# Patient Record
Sex: Female | Born: 1955 | Race: White | Hispanic: No | Marital: Married | Smoking: Never smoker
Health system: Southern US, Community
[De-identification: ages and names within clinical notes are randomized; demographics above are authoritative.]

## PROBLEM LIST (undated history)

## (undated) DIAGNOSIS — N200 Calculus of kidney: Secondary | ICD-10-CM

## (undated) DIAGNOSIS — E079 Disorder of thyroid, unspecified: Secondary | ICD-10-CM

## (undated) HISTORY — PX: LITHOTRIPSY: SUR834

## (undated) HISTORY — PX: THYROID SURGERY: SHX805

## (undated) HISTORY — PX: APPENDECTOMY: SHX54

---

## 2009-06-04 ENCOUNTER — Ambulatory Visit: Payer: Self-pay | Admitting: Radiology

## 2009-06-04 ENCOUNTER — Emergency Department (HOSPITAL_BASED_OUTPATIENT_CLINIC_OR_DEPARTMENT_OTHER): Admission: EM | Admit: 2009-06-04 | Discharge: 2009-06-04 | Payer: Self-pay | Admitting: Emergency Medicine

## 2015-08-09 ENCOUNTER — Emergency Department (HOSPITAL_BASED_OUTPATIENT_CLINIC_OR_DEPARTMENT_OTHER)
Admission: EM | Admit: 2015-08-09 | Discharge: 2015-08-09 | Disposition: A | Discharge status: Home / Self Care | Payer: BLUE CROSS/BLUE SHIELD | Attending: Emergency Medicine | Admitting: Emergency Medicine

## 2015-08-09 ENCOUNTER — Encounter (HOSPITAL_BASED_OUTPATIENT_CLINIC_OR_DEPARTMENT_OTHER): Payer: Self-pay | Admitting: *Deleted

## 2015-08-09 ENCOUNTER — Emergency Department (HOSPITAL_BASED_OUTPATIENT_CLINIC_OR_DEPARTMENT_OTHER): Payer: BLUE CROSS/BLUE SHIELD

## 2015-08-09 DIAGNOSIS — Z9049 Acquired absence of other specified parts of digestive tract: Secondary | ICD-10-CM | POA: Diagnosis not present

## 2015-08-09 DIAGNOSIS — Z87442 Personal history of urinary calculi: Secondary | ICD-10-CM | POA: Insufficient documentation

## 2015-08-09 DIAGNOSIS — Z791 Long term (current) use of non-steroidal anti-inflammatories (NSAID): Secondary | ICD-10-CM | POA: Diagnosis not present

## 2015-08-09 DIAGNOSIS — R1031 Right lower quadrant pain: Secondary | ICD-10-CM | POA: Diagnosis not present

## 2015-08-09 DIAGNOSIS — Z91040 Latex allergy status: Secondary | ICD-10-CM | POA: Diagnosis not present

## 2015-08-09 DIAGNOSIS — M545 Low back pain: Secondary | ICD-10-CM | POA: Diagnosis present

## 2015-08-09 DIAGNOSIS — M5441 Lumbago with sciatica, right side: Secondary | ICD-10-CM

## 2015-08-09 DIAGNOSIS — Z87440 Personal history of urinary (tract) infections: Secondary | ICD-10-CM | POA: Insufficient documentation

## 2015-08-09 DIAGNOSIS — E079 Disorder of thyroid, unspecified: Secondary | ICD-10-CM | POA: Diagnosis not present

## 2015-08-09 HISTORY — DX: Calculus of kidney: N20.0

## 2015-08-09 HISTORY — DX: Disorder of thyroid, unspecified: E07.9

## 2015-08-09 LAB — URINALYSIS, ROUTINE W REFLEX MICROSCOPIC
BILIRUBIN URINE: NEGATIVE
GLUCOSE, UA: NEGATIVE mg/dL
Ketones, ur: NEGATIVE mg/dL
Nitrite: NEGATIVE
PROTEIN: NEGATIVE mg/dL
SPECIFIC GRAVITY, URINE: 1.021 (ref 1.005–1.030)
pH: 6.5 (ref 5.0–8.0)

## 2015-08-09 LAB — COMPREHENSIVE METABOLIC PANEL
ALBUMIN: 4.1 g/dL (ref 3.5–5.0)
ALT: 24 U/L (ref 14–54)
AST: 23 U/L (ref 15–41)
Alkaline Phosphatase: 53 U/L (ref 38–126)
Anion gap: 7 (ref 5–15)
BILIRUBIN TOTAL: 0.7 mg/dL (ref 0.3–1.2)
BUN: 31 mg/dL — AB (ref 6–20)
CO2: 29 mmol/L (ref 22–32)
CREATININE: 0.58 mg/dL (ref 0.44–1.00)
Calcium: 9.5 mg/dL (ref 8.9–10.3)
Chloride: 104 mmol/L (ref 101–111)
GFR calc Af Amer: >60 mL/min (ref >60)
GFR calc non Af Amer: >60 mL/min (ref >60)
GLUCOSE: 99 mg/dL (ref 65–99)
POTASSIUM: 3.9 mmol/L (ref 3.5–5.1)
Sodium: 140 mmol/L (ref 135–145)
TOTAL PROTEIN: 6.7 g/dL (ref 6.5–8.1)

## 2015-08-09 LAB — URINE MICROSCOPIC-ADD ON

## 2015-08-09 LAB — CBC WITH DIFFERENTIAL/PLATELET
BASOS ABS: 0 10*3/uL (ref 0.0–0.1)
Basophils Relative: 0 %
EOS PCT: 2 %
Eosinophils Absolute: 0.1 10*3/uL (ref 0.0–0.7)
HEMATOCRIT: 41.7 % (ref 36.0–46.0)
HEMOGLOBIN: 13.8 g/dL (ref 12.0–15.0)
LYMPHS ABS: 1.4 10*3/uL (ref 0.7–4.0)
LYMPHS PCT: 27 %
MCH: 31.4 pg (ref 26.0–34.0)
MCHC: 33.1 g/dL (ref 30.0–36.0)
MCV: 94.8 fL (ref 78.0–100.0)
Monocytes Absolute: 0.5 10*3/uL (ref 0.1–1.0)
Monocytes Relative: 9 %
NEUTROS ABS: 3.3 10*3/uL (ref 1.7–7.7)
NEUTROS PCT: 62 %
PLATELETS: 195 10*3/uL (ref 150–400)
RBC: 4.4 MIL/uL (ref 3.87–5.11)
RDW: 12.1 % (ref 11.5–15.5)
WBC: 5.3 10*3/uL (ref 4.0–10.5)

## 2015-08-09 LAB — LIPASE, BLOOD: Lipase: 25 U/L (ref 11–51)

## 2015-08-09 MED ORDER — LIDOCAINE-EPINEPHRINE (PF) 2 %-1:200000 IJ SOLN
10.0000 mL | Freq: Once | INTRAMUSCULAR | Status: DC
  Filled 2015-08-09: qty 10

## 2015-08-09 MED ORDER — KETOROLAC TROMETHAMINE 30 MG/ML IJ SOLN
30.0000 mg | Freq: Once | INTRAMUSCULAR | Status: AC
  Administered 2015-08-09: 30 mg via INTRAVENOUS
  Filled 2015-08-09: qty 1

## 2015-08-09 MED ORDER — KETOROLAC TROMETHAMINE 30 MG/ML IJ SOLN: 30.0000 mg | Freq: Once | INTRAMUSCULAR | Status: DC

## 2015-08-09 MED ORDER — IBUPROFEN 800 MG PO TABS: 800.0000 mg | ORAL_TABLET | Freq: Three times a day (TID) | ORAL | Status: AC

## 2015-08-09 NOTE — ED Provider Notes (Signed)
CSN: [id number]     Arrival date & time 08/09/15  1146 History   First MD Initiated Contact with Patient 08/09/15 1202     Chief Complaint  Patient presents with  . Back Pain     (Consider location/radiation/quality/duration/timing/severity/associated sxs/prior Treatment) HPI   Patient is a 59 year old female with past medical history of multiple kidney stones requiring lithotripsy who presents to the ED with complaint of right lower back pain, onset 2 months. Patient reports 2 months ago she slipped on a pecan, and resulting in her straining her back to keep herself from falling. Patient reports having constant severe cramping pain to her right lower back that radiates down her right leg and across to her right lower quadrant/groin region. She reports the pain is worse when bending over. She notes she has been using ibuprofen at home with no relief. Pt denies fever, numbness, tingling, saddle anesthesia, loss of bowel or bladder, urinary sxs, N/V, weakness, IVDU, cancer. Patient reports seeing a chiropractor twice over 3 weeks ago with no improvement of pain.  She notes she was seen at [facility] ED a few weeks ago, negative CT scan and was discharged home with oxycodone. She reports no relief of symptoms with oxycodone. She notes she was recently diagnosed with a UTI and had a lithotripsy done 4 days ago, she reports she has since finished her antibiotics.  Past Medical History  Diagnosis Date  . Kidney calculus   . Thyroid disease    Past Surgical History  Procedure Laterality Date  . Lithotripsy    . Thyroid surgery    . Appendectomy     History reviewed. No pertinent family history. Social History  Substance Use Topics  . Smoking status: Never Smoker   . Smokeless tobacco: None  . Alcohol Use: No   OB History    No data available     Review of Systems  Gastrointestinal: Positive for abdominal pain.  Musculoskeletal: Positive for back pain.  All other systems reviewed and  are negative.     Allergies  Augmentin; Ivp dye; Latex; and Versed  Home Medications   Prior to Admission medications   Medication Sig Start Date End Date Taking? Authorizing Provider  levothyroxine (SYNTHROID, LEVOTHROID) 125 MCG tablet Take 125 mcg by mouth daily before breakfast.   Yes Historical Provider, MD  naproxen (NAPROSYN) 500 MG tablet Take 500 mg by mouth 2 (two) times daily with a meal.   Yes Historical Provider, MD  ibuprofen (ADVIL,MOTRIN) 800 MG tablet Take 1 tablet (800 mg total) by mouth 3 (three) times daily. 08/09/15   Satira Sark Nadeau, PA-C   BP 121/74 mmHg  Pulse 82  Temp(Src) 98.5 F (36.9 C) (Oral)  Resp 16  Ht  (1.727 m)  Wt 61.236 kg  BMI 20.53 kg/m2  SpO2 100% Physical Exam  Constitutional: She is oriented to person, place, and time. She appears well-developed and well-nourished. No distress.  HENT:  Head: Normocephalic and atraumatic.  Mouth/Throat: Oropharynx is clear and moist. No oropharyngeal exudate.  Eyes: Conjunctivae and EOM are normal. Pupils are equal, round, and reactive to light. Right eye exhibits no discharge. Left eye exhibits no discharge. No scleral icterus.  Neck: Normal range of motion. Neck supple.  Cardiovascular: Normal rate, regular rhythm, normal heart sounds and intact distal pulses.   Pulmonary/Chest: Effort normal and breath sounds normal. No respiratory distress. She has no wheezes. She has no rales. She exhibits no tenderness.  Abdominal: Soft. Bowel sounds are  normal. She exhibits no distension and no mass. There is tenderness in the right lower quadrant. There is no rigidity, no rebound, no guarding, no CVA tenderness, no tenderness at McBurney's point and negative Murphy's sign.  Musculoskeletal: She exhibits tenderness. She exhibits no edema.       Lumbar back: She exhibits decreased range of motion (due to pain), tenderness, bony tenderness and swelling (mild swelling noted over lower lumbar vertebra). She  exhibits no edema, no deformity, no laceration and no spasm.  Mild tenderness over lumbar midline and right sided paraspinal muscles. Positive right sided straight leg raise. 5/5 strength BLE. Decreased ROM of back and right hip due to pain. 2+ PT pulses. FROM of bilateral knees, ankles and feet. Sensation intact.   Lymphadenopathy:    She has no cervical adenopathy.  Neurological: She is alert and oriented to person, place, and time. She has normal strength and normal reflexes. No sensory deficit.  Pt able to stand and ambulate but reports pain during ambulation to right lower back and thigh.  Skin: Skin is warm and dry. She is not diaphoretic.  Nursing note and vitals reviewed.   ED Course  Procedures (including critical care time) Labs Review Labs Reviewed  COMPREHENSIVE METABOLIC PANEL - Abnormal; Notable for the following:    BUN 31 (*)    All other components within normal limits  URINALYSIS, ROUTINE W REFLEX MICROSCOPIC (NOT AT [facility]) - Abnormal; Notable for the following:    APPearance CLOUDY (*)    Hgb urine dipstick MODERATE (*)    Leukocytes, UA SMALL (*)    All other components within normal limits  URINE MICROSCOPIC-ADD ON - Abnormal; Notable for the following:    Squamous Epithelial / LPF 0-5 (*)    Bacteria, UA FEW (*)    All other components within normal limits  CBC WITH DIFFERENTIAL/PLATELET  LIPASE, BLOOD    Imaging Review Dg Lumbar Spine Complete  08/09/2015  CLINICAL DATA:  Lumbago and spasm after slipping and nearly falling 1 month prior EXAM: LUMBAR SPINE - COMPLETE 4+ VIEW COMPARISON:  None. FINDINGS: Frontal, lateral, spot lumbosacral lateral, and bilateral oblique views were obtained. There are 5 non-rib-bearing lumbar type vertebral bodies. There is slight thoracolumbar dextroscoliosis. There is no fracture or spondylolisthesis. There is mild disc space narrowing at L4-5. Other disc spaces appear unremarkable. There is facet osteoarthritic change at L4-5 and  L5-S1 bilaterally. There are several small calcifications in the region of the upper pole of the right kidney. 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 Slight scoliosis. No fracture or spondylolisthesis. Small calcifications in the upper pole of the right kidney. Electronically Signed   By: Bretta Bang III M.D.   On: 08/09/2015 13:30   I have personally reviewed and evaluated these images and lab results as part of my medical decision-making.  Filed Vitals:   08/09/15 1159  BP: 121/74  Pulse: 82  Temp: 98.5 F (36.9 C)  Resp: 16     MDM   Final diagnoses:  Right-sided low back pain with right-sided sciatica    Patient presents with right lower back pain that occurred after slipping on a peak on and straining her back. No back pain red flags. She reports having recent lithotripsy done 4 days ago and finishing her antibiotics for recent UTI prior to the procedure. VSS. Revealed mild tenderness over lumbar midline and right paraspinal muscles, mild swelling noted over midline and right paraspinal muscles lumbar region, positive right straight leg raise.  No neuro deficits. Patient able to stand and ambulate and room. Patient given pain meds in the ED. Lumbar x-ray negative. Labs unremarkable. UA revealed moderate age to be, I suspect this is likely associated with recent lithotripsy. I do not suspect spinal cord compression/cauda equina syndrome or infectious etiology at this time. Patient notes her pain has not improved. Plan to do a trigger point injection for pain relief. I injected 5 mL of 2% lidocaine with epi to right lower lumbar paraspinal region. Patient reports immediate relief of pain after injection.  I suspect pain is likely musculoskeletal in etiology. Plan to discharge patient home with NSAIDs and advised patient to follow up with primary care provider.   Satira Sarkicole Elizabeth MelvernNadeau, [place] JerseyPA-C 08/09/15 2321  Gwyneth SproutWhitney Plunkett, MD 08/10/15 [phone]

## 2015-08-09 NOTE — ED Notes (Signed)
Pt c/o lower right back pain x 2 months. Seen at [place] ED ct scan neg.

## 2015-08-09 NOTE — Discharge Instructions (Signed)
Take your medications as prescribed as needed for pain relief. You may also continue applying heat for pain relief as needed. Follow-up with your primary care provider in 4-5 days.  Please return to the Emergency Department if symptoms worsen or new onset of fever, numbness, tingling, saddle anesthesia, loss of bowel or bladder, weakness.

## 2016-05-28 ENCOUNTER — Emergency Department (HOSPITAL_BASED_OUTPATIENT_CLINIC_OR_DEPARTMENT_OTHER)
Admission: EM | Admit: 2016-05-28 | Discharge: 2016-05-28 | Disposition: A | Discharge status: Home / Self Care | Payer: BLUE CROSS/BLUE SHIELD | Attending: Emergency Medicine | Admitting: Emergency Medicine

## 2016-05-28 ENCOUNTER — Emergency Department (HOSPITAL_BASED_OUTPATIENT_CLINIC_OR_DEPARTMENT_OTHER): Payer: BLUE CROSS/BLUE SHIELD

## 2016-05-28 ENCOUNTER — Encounter (HOSPITAL_BASED_OUTPATIENT_CLINIC_OR_DEPARTMENT_OTHER): Payer: Self-pay | Admitting: *Deleted

## 2016-05-28 DIAGNOSIS — Y929 Unspecified place or not applicable: Secondary | ICD-10-CM | POA: Insufficient documentation

## 2016-05-28 DIAGNOSIS — S4992XA Unspecified injury of left shoulder and upper arm, initial encounter: Secondary | ICD-10-CM | POA: Diagnosis present

## 2016-05-28 DIAGNOSIS — Y999 Unspecified external cause status: Secondary | ICD-10-CM | POA: Insufficient documentation

## 2016-05-28 DIAGNOSIS — W109XXA Fall (on) (from) unspecified stairs and steps, initial encounter: Secondary | ICD-10-CM | POA: Insufficient documentation

## 2016-05-28 DIAGNOSIS — Z79899 Other long term (current) drug therapy: Secondary | ICD-10-CM | POA: Diagnosis not present

## 2016-05-28 DIAGNOSIS — Z791 Long term (current) use of non-steroidal anti-inflammatories (NSAID): Secondary | ICD-10-CM | POA: Diagnosis not present

## 2016-05-28 DIAGNOSIS — S42002A Fracture of unspecified part of left clavicle, initial encounter for closed fracture: Secondary | ICD-10-CM

## 2016-05-28 DIAGNOSIS — S42032A Displaced fracture of lateral end of left clavicle, initial encounter for closed fracture: Secondary | ICD-10-CM | POA: Diagnosis not present

## 2016-05-28 DIAGNOSIS — Y939 Activity, unspecified: Secondary | ICD-10-CM | POA: Insufficient documentation

## 2016-05-28 NOTE — ED Provider Notes (Signed)
[facility] Provider [facility]   CSN: [id number] Arrival date & time: 05/28/16  [id number]     History   Chief Complaint Chief Complaint  Patient presents with  . Shoulder Injury    HPI Bethany Macias is a 60 y.o. female.  HPI Patient presents to the emergency department with left shoulder pain following a fall that occurred last night.  The patient states that she tripped over her dog and fell down her staircase.  The patient states that she did not hit her head.  The patient states that she took Aleve with some relief of her symptoms.  Patient states that movement and palpation make the pain worse. The patient denies chest pain, shortness of breath, headache,blurred vision, neck pain, fever, cough, weakness, numbness, dizziness, anorexia, edema, abdominal pain, nausea, vomiting, diarrhea, rash, back pain, dysuria, hematemesis, bloody stool, near syncope, or syncope. Past Medical History:  Diagnosis Date  . Kidney calculus   . Thyroid disease     There are no active problems to display for this patient.   Past Surgical History:  Procedure Laterality Date  . APPENDECTOMY    . LITHOTRIPSY    . THYROID SURGERY      OB History    No data available       Home Medications    Prior to Admission medications   Medication Sig Start Date End Date Taking? Authorizing Provider  ibuprofen (ADVIL,MOTRIN) 800 MG tablet Take 1 tablet (800 mg total) by mouth 3 (three) times daily. 08/09/15  Yes Barrett HenleNicole Elizabeth Nadeau, PA-C  levothyroxine (SYNTHROID, LEVOTHROID) 125 MCG tablet Take 125 mcg by mouth daily before breakfast.   Yes Historical Provider, MD  naproxen (NAPROSYN) 500 MG tablet Take 500 mg by mouth 2 (two) times daily with a meal.   Yes Historical Provider, MD    Family History No family history on file.  Social History Social History  Substance Use Topics  . Smoking status: Never Smoker  . Smokeless tobacco: Never Used  . Alcohol use No     Allergies     Augmentin [amoxicillin-pot clavulanate]; Ivp dye [iodinated diagnostic agents]; Latex; and Versed [midazolam]   Review of Systems Review of Systems  All other systems negative except as documented in the HPI. All pertinent positives and negatives as reviewed in the HPI. Physical Exam Updated Vital Signs BP 132/92 (BP Location: Right Arm)   Pulse 89   Temp 97.7 F (36.5 C) (Oral)   Resp 18   Ht 5\' 7"  (1.702 m)   Wt 58.1 kg   SpO2 100%   BMI 20.05 kg/m   Physical Exam  Constitutional: She is oriented to person, place, and time. She appears well-developed and well-nourished. No distress.  HENT:  Head: Normocephalic and atraumatic.  Mouth/Throat: Oropharynx is clear and moist.  Eyes: Pupils are equal, round, and reactive to light.  Neck: Normal range of motion. Neck supple.  Cardiovascular: Normal rate, regular rhythm and normal heart sounds.  Exam reveals no gallop and no friction rub.   No murmur heard. Pulmonary/Chest: Effort normal and breath sounds normal. No respiratory distress. She has no wheezes.  Neurological: She is alert and oriented to person, place, and time. She exhibits normal muscle tone. Coordination normal.  Skin: Skin is warm and dry. No rash noted. No erythema.  Psychiatric: She has a normal mood and affect. Her behavior is normal.  Nursing note and vitals reviewed.    ED Treatments / Results  Labs (all labs ordered are  listed, but only abnormal results are displayed) Labs Reviewed - No data to display  EKG  EKG Interpretation None       Radiology Dg Shoulder Left  Result Date: 05/28/2016 CLINICAL DATA:  Fall EXAM: LEFT SHOULDER - 2+ VIEW COMPARISON:  None. FINDINGS: There is an oblique fracture of the distal shaft of the clavicle. The fracture fragments are in near anatomic alignment. No dislocations. IMPRESSION: 1. Acute distal clavicle fracture. Electronically Signed   By: Signa Kell M.D.   On: 05/28/2016 09:55    Procedures Procedures  (including critical care time)  Medications Ordered in ED Medications - No data to display   Initial Impression / Assessment and Plan / ED Course  I have reviewed the triage vital signs and the nursing notes.  Pertinent labs & imaging results that were available during my care of the patient were reviewed by me and considered in my medical decision making (see chart for details).  Clinical Course  Patient be referred to orthopedics to return here as needed.  Patient agrees the plan and all questions were answered.  I advised her to ice and elevate the area  Final Clinical Impressions(s) / ED Diagnoses   Final diagnoses:  None    New Prescriptions New Prescriptions   No medications on file     Charlestine Night, PA-C 05/28/16 1210    Loren Racer, MD 05/28/16 1521

## 2016-05-28 NOTE — Discharge Instructions (Signed)
Return here as needed.  Follow-up with the orthopedist provided.  Use ice over the area

## 2016-05-28 NOTE — ED Triage Notes (Signed)
States she was walking down her inside stairs that are wood and missed first step and fell to bottom of stairs about 2030 last pm. Bruising noted to left shoulder. No LOC. Ambulatory to room without difficulty. [facility] and they n[facility] her kidney she bled out to a HBG of 4 and her heart went into A-fib and she is wearing a heart monitor to left upper chest.

## 2018-05-20 IMAGING — CR DG SHOULDER 2+V*L*
3 series · 3 of 3 positions shown · non-contrast
Comparison: None.

CLINICAL DATA: Fall

EXAM:
LEFT SHOULDER - 2+ VIEW

[w shoulder grashey left]
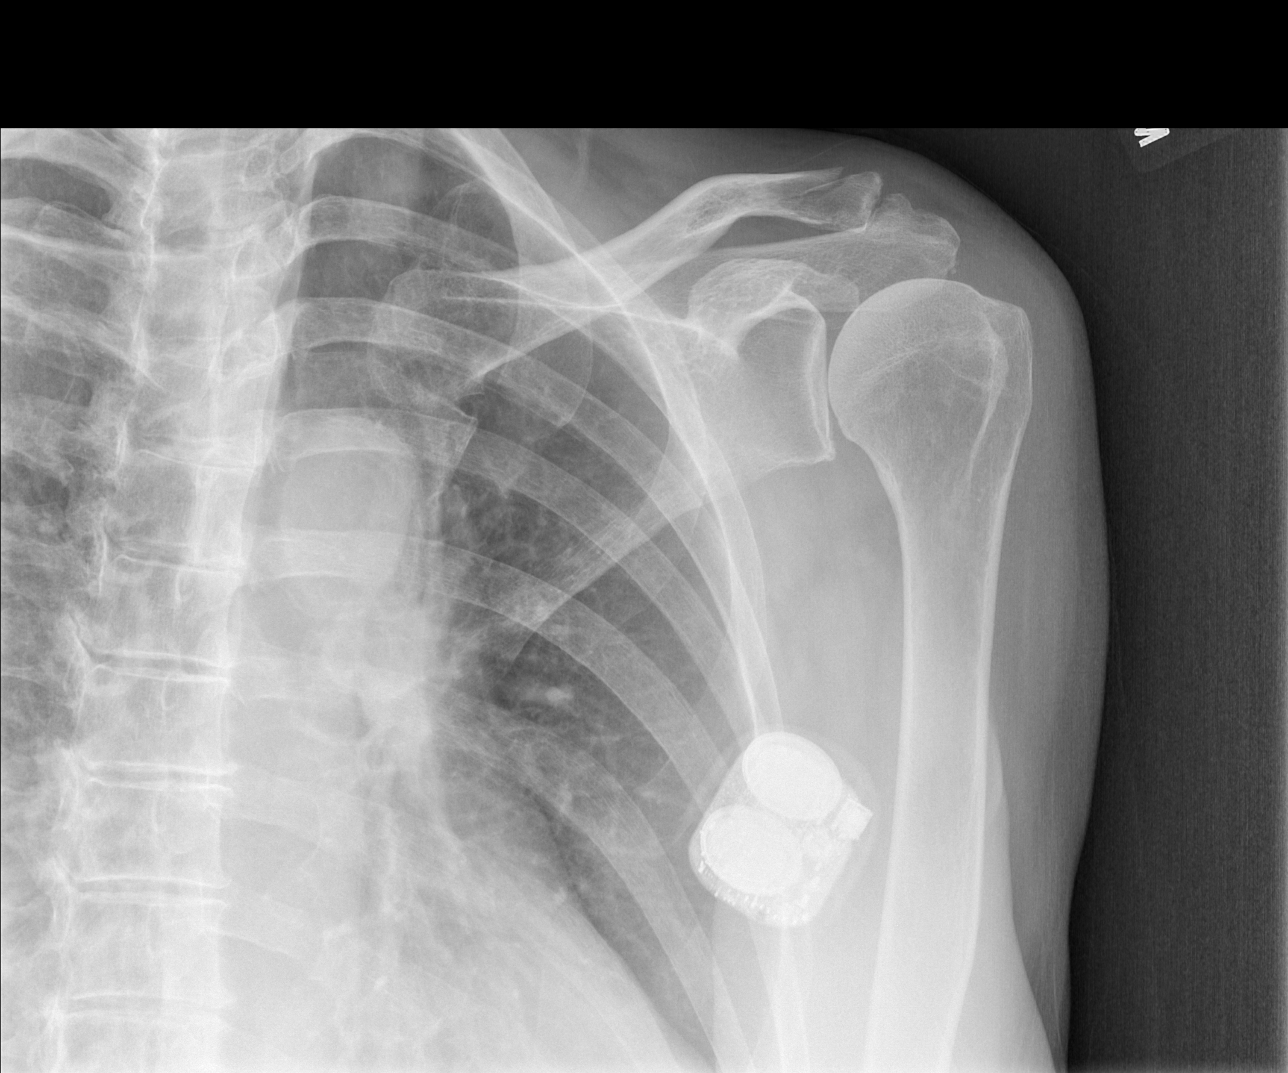

[w shoulder y view left]
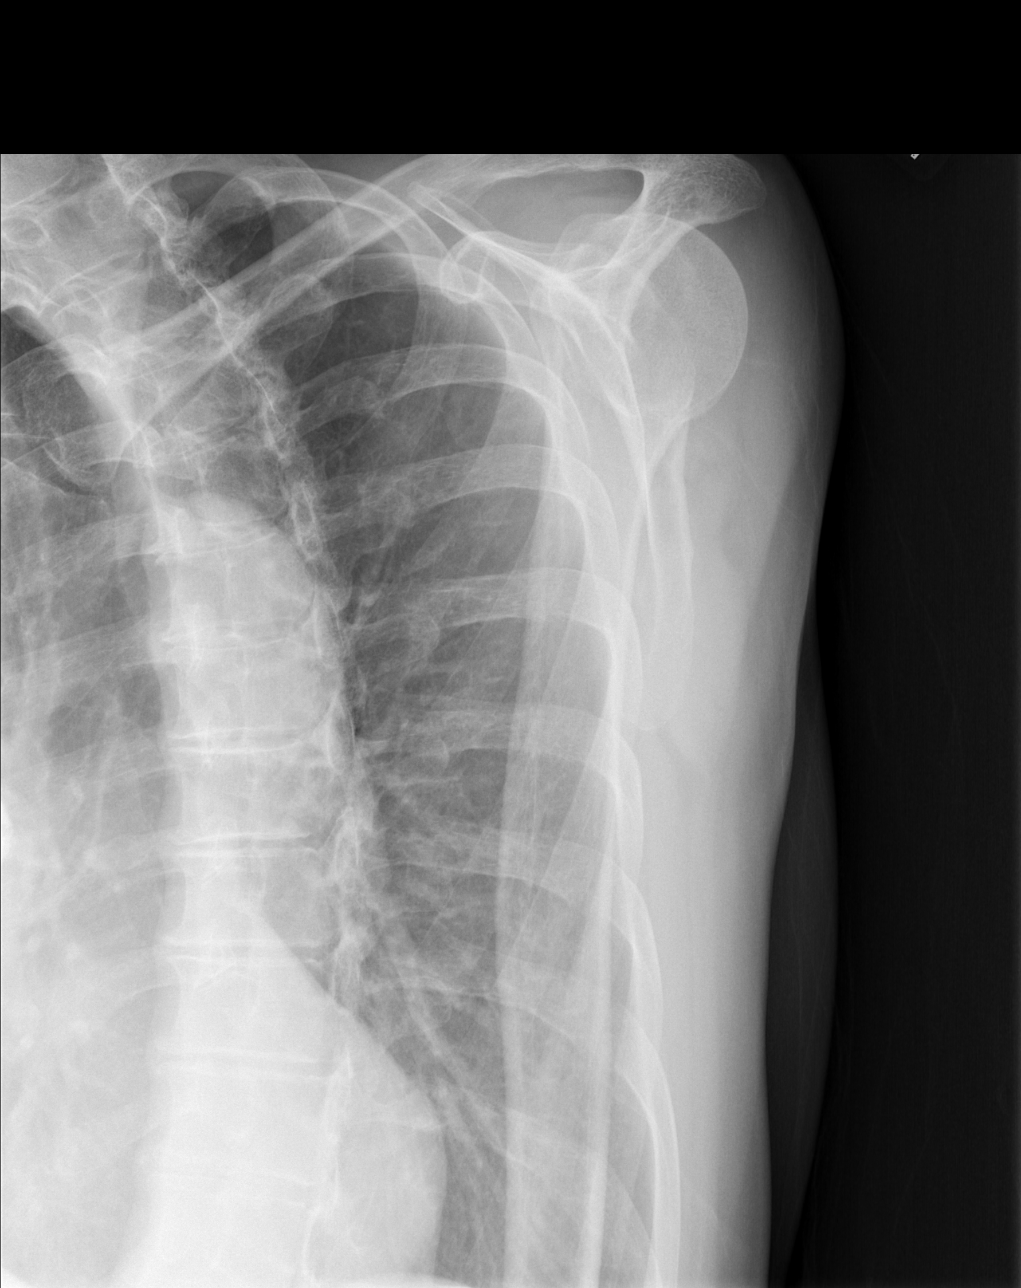

[x shoulder axillary left *]
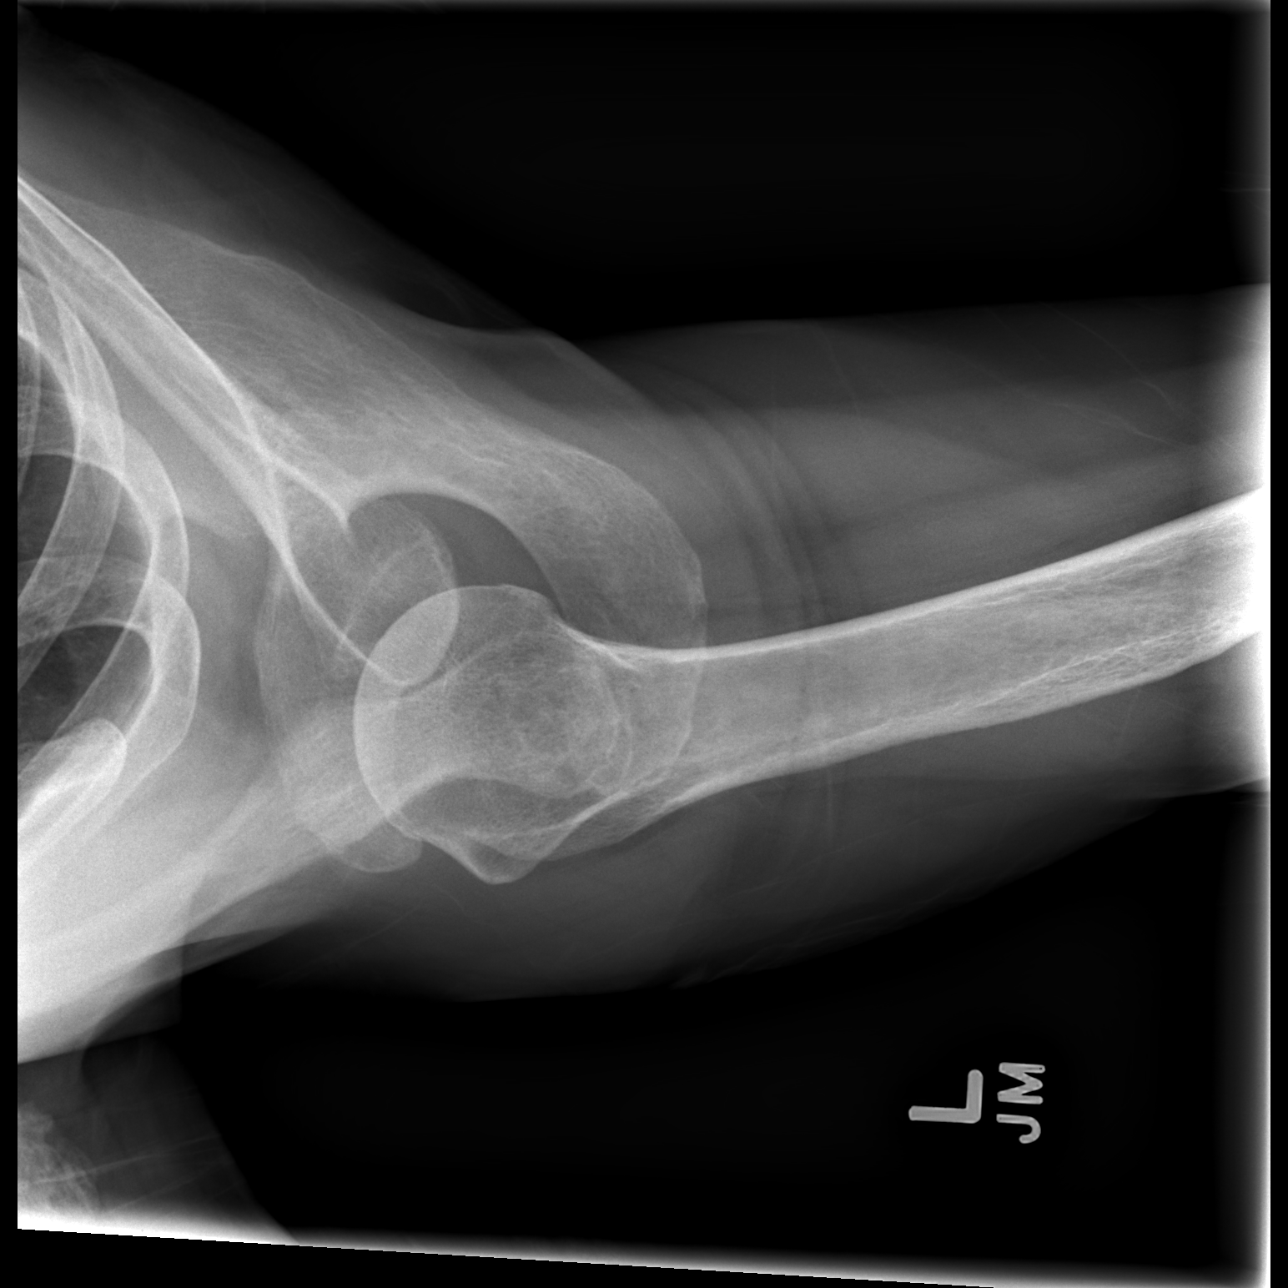

[3 of 3 positions shown; findings below may reference images not displayed]

FINDINGS: There is an oblique fracture of the distal shaft of the clavicle.
The fracture fragments are in near anatomic alignment. No
dislocations.
IMPRESSION: 1. Acute distal clavicle fracture.

## 2020-08-09 ENCOUNTER — Other Ambulatory Visit: Payer: Self-pay

## 2020-08-09 ENCOUNTER — Encounter (HOSPITAL_BASED_OUTPATIENT_CLINIC_OR_DEPARTMENT_OTHER): Payer: Self-pay

## 2020-08-09 ENCOUNTER — Emergency Department (HOSPITAL_BASED_OUTPATIENT_CLINIC_OR_DEPARTMENT_OTHER): Payer: BC Managed Care – PPO

## 2020-08-09 ENCOUNTER — Emergency Department (HOSPITAL_BASED_OUTPATIENT_CLINIC_OR_DEPARTMENT_OTHER)
Admission: EM | Admit: 2020-08-09 | Discharge: 2020-08-09 | Disposition: A | Discharge status: Home / Self Care | Payer: BC Managed Care – PPO | Attending: Emergency Medicine | Admitting: Emergency Medicine

## 2020-08-09 DIAGNOSIS — Z91040 Latex allergy status: Secondary | ICD-10-CM | POA: Diagnosis not present

## 2020-08-09 DIAGNOSIS — R600 Localized edema: Secondary | ICD-10-CM | POA: Insufficient documentation

## 2020-08-09 DIAGNOSIS — R002 Palpitations: Secondary | ICD-10-CM | POA: Diagnosis not present

## 2020-08-09 LAB — CBC WITH DIFFERENTIAL/PLATELET
Abs Immature Granulocytes: 0.03 10*3/uL (ref 0.00–0.07)
Basophils Absolute: 0 10*3/uL (ref 0.0–0.1)
Basophils Relative: 1 %
Eosinophils Absolute: 0.1 10*3/uL (ref 0.0–0.5)
Eosinophils Relative: 1 %
HCT: 40.5 % (ref 36.0–46.0)
Hemoglobin: 13.7 g/dL (ref 12.0–15.0)
Immature Granulocytes: 1 %
Lymphocytes Relative: 26 %
Lymphs Abs: 1.4 10*3/uL (ref 0.7–4.0)
MCH: 32.2 pg (ref 26.0–34.0)
MCHC: 33.8 g/dL (ref 30.0–36.0)
MCV: 95.1 fL (ref 80.0–100.0)
Monocytes Absolute: 0.5 10*3/uL (ref 0.1–1.0)
Monocytes Relative: 8 %
Neutro Abs: 3.5 10*3/uL (ref 1.7–7.7)
Neutrophils Relative %: 63 %
Platelets: 188 10*3/uL (ref 150–400)
RBC: 4.26 MIL/uL (ref 3.87–5.11)
RDW: 11.9 % (ref 11.5–15.5)
WBC: 5.5 10*3/uL (ref 4.0–10.5)
nRBC: 0 % (ref 0.0–0.2)

## 2020-08-09 LAB — COMPREHENSIVE METABOLIC PANEL
ALT: 22 U/L (ref 0–44)
AST: 25 U/L (ref 15–41)
Albumin: 3.7 g/dL (ref 3.5–5.0)
Alkaline Phosphatase: 49 U/L (ref 38–126)
Anion gap: 9 (ref 5–15)
BUN: 31 mg/dL — ABNORMAL HIGH (ref 8–23)
CO2: 29 mmol/L (ref 22–32)
Calcium: 8.6 mg/dL — ABNORMAL LOW (ref 8.9–10.3)
Chloride: 101 mmol/L (ref 98–111)
Creatinine, Ser: 0.68 mg/dL (ref 0.44–1.00)
GFR, Estimated: >60 mL/min (ref >60)
Glucose, Bld: 99 mg/dL (ref 70–99)
Potassium: 3.5 mmol/L (ref 3.5–5.1)
Sodium: 139 mmol/L (ref 135–145)
Total Bilirubin: 1.1 mg/dL (ref 0.3–1.2)
Total Protein: 6.2 g/dL — ABNORMAL LOW (ref 6.5–8.1)

## 2020-08-09 LAB — BRAIN NATRIURETIC PEPTIDE: B Natriuretic Peptide: 66.9 pg/mL (ref 0.0–100.0)

## 2020-08-09 NOTE — ED Provider Notes (Signed)
[facility] EMERGENCY DEPARTMENT Provider Note   CSN: [id number] Arrival date & time: 08/09/20  1024     History Chief Complaint  Patient presents with  . Leg Swelling    Bethany Macias is a 64 y.o. female presenting to the emergency department with complaint of about 4 days of worsening lower extremity edema. Patient states her lower legs have had some moderate swelling that is worse throughout the day. She is also been feeling a little bit more winded with exertion than usual. She feels some palpitations when she lays flat at night though this is not new for her and has been ongoing for multiple years. She reports both of her parents had heart failure around this age. She has never had known cardiac issues. She is followed by [facility] PCP, however has been unable to obtain an appointment with them. 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 She also admits to high salt diet which she has been trying to improve over the last few days since her swelling began. She is also trying to drink more water daily to help her swelling. The swelling improves somewhat when she elevates her legs.  Denies associated chest pain, cough, abdominal pain.  The history is provided by the patient.       Past Medical History:  Diagnosis Date  . Kidney calculus   . Thyroid disease     There are no problems to display for this patient.   Past Surgical History:  Procedure Laterality Date  . APPENDECTOMY    . LITHOTRIPSY    . THYROID SURGERY       OB History   No obstetric history on file.     History reviewed. No pertinent family history.  Social History   Tobacco Use  . Smoking status: Never Smoker  . Smokeless tobacco: Never Used  Substance Use Topics  . Alcohol use: Yes    Alcohol/week: 7.0 standard drinks    Types: 7 Glasses of wine per week  . Drug use: No    Home Medications Prior to Admission medications   Medication Sig Start Date End Date  Taking? Authorizing Provider  ibuprofen (ADVIL,MOTRIN) 800 MG tablet Take 1 tablet (800 mg total) by mouth 3 (three) times daily. 08/09/15   Barrett Henle, PA-C  levothyroxine (SYNTHROID, LEVOTHROID) 125 MCG tablet Take 125 mcg by mouth daily before breakfast.    [provider]  naproxen (NAPROSYN) 500 MG tablet Take 500 mg by mouth 2 (two) times daily with a meal.    [provider]    Allergies    Augmentin [amoxicillin-pot clavulanate], Ivp dye [iodinated diagnostic agents], Latex, and Versed [midazolam]  Review of Systems   Review of Systems  Respiratory: Positive for shortness of breath.   Cardiovascular: Positive for leg swelling.  All other systems reviewed and are negative.   Physical Exam Updated Vital Signs BP 123/78 (BP Location: Right Arm)   Pulse 63   Temp 97.9 F (36.6 C) (Oral)   Resp 19   Ht 5\' 7"  (1.702 m)   Wt 60.3 kg   SpO2 99%   BMI 20.83 kg/m   Physical Exam Vitals and nursing note reviewed.  Constitutional:      General: She is not in acute distress.    Appearance: She is well-developed. She is not ill-appearing.  HENT:     Head: Normocephalic and atraumatic.  Eyes:     Conjunctiva/sclera: Conjunctivae normal.  Cardiovascular:  Rate and Rhythm: Normal rate and regular rhythm.     Comments: Normal DP pulses bilaterally Pulmonary:     Effort: Pulmonary effort is normal. No respiratory distress.     Breath sounds: Normal breath sounds.     Comments: Speaking in full sentences Abdominal:     General: Bowel sounds are normal.     Palpations: Abdomen is soft.     Tenderness: There is no abdominal tenderness.  Musculoskeletal:     Comments: 1+ pitting edema bilateral lower legs. no erythema or warmth.  Skin:    General: Skin is warm.  Neurological:     Mental Status: She is alert.  Psychiatric:        Behavior: Behavior normal.     ED Results / Procedures / Treatments   Labs (all labs ordered are listed, but  only abnormal results are displayed) Labs Reviewed  COMPREHENSIVE METABOLIC PANEL - Abnormal; Notable for the following components:      Result Value   BUN 31 (*)    Calcium 8.6 (*)    Total Protein 6.2 (*)    All other components within normal limits  CBC WITH DIFFERENTIAL/PLATELET  BRAIN NATRIURETIC PEPTIDE    EKG EKG Interpretation  Date/Time:  Thursday August 09 2020 12:04:42 EST Ventricular Rate:  74 PR Interval:    QRS Duration: 107 QT Interval:  404 QTC Calculation: 449 R Axis:   32 Text Interpretation: Sinus rhythm Multiple premature complexes, vent & supraven Consider left atrial enlargement noacute ischemic appearance. no old comparison Confirmed by Arby Barrette [phone]) on 08/09/2020 1:25:40 PM   Radiology DG Chest 2 View  Result Date: 08/09/2020 CLINICAL DATA:  Shortness of breath and leg swelling. EXAM: CHEST - 2 VIEW COMPARISON:  06/04/2009 FINDINGS: The lungs are clear without focal pneumonia, edema, pneumothorax or pleural effusion. The cardiopericardial silhouette is within normal limits for size. The visualized bony structures of the thorax show no acute abnormality. Telemetry leads overlie the chest. IMPRESSION: No active cardiopulmonary disease. Electronically Signed   By: Kennith Center M.D.   On: 08/09/2020 12:13    Procedures Procedures (including critical care time)  Medications Ordered in ED Medications - No data to display  ED Course  I have reviewed the triage vital signs and the nursing notes.  Pertinent labs & imaging results that were available during my care of the patient were reviewed by me and considered in my medical decision making (see chart for details).    MDM Rules/Calculators/A&P                          Patient presenting with about 4 days of worsening lower leg edema.  She does stand on her feet all day for work about 10 to 12-hour days.  Her symptoms are worse by the end of the day.  She states she is always felt some  palpitations when she lays flat in bed for many years however the other day she felt slightly more winded with exertion than usual.  She is not having any chest pain, no fevers or cough.  Her parents had heart failure, however she has had no issues with this in the past.  She is followed by PCP, however is having difficulty getting an appointment therefore she presents today.  No prior cardiac imaging done.  On exam she is well-appearing and in no distress.  Speaking in full sentences.  Lungs are clear.  She has 1+ pitting edema  to bilateral lower extremities, no erythema or warmth.  Laboratory work-up ordered and is very reassuring.  BNP is within normal limits.  EKG is nonischemic.  Chest x-ray is clear.  CBC and metabolic panel are without acute significant abnormalities.  Vital signs remained stable.  She does admit to high salt intake, recommend she continue to limit her salty intake, compression socks, elevation when possible.  She is instructed to follow with PCP should symptoms persist.  She is well-appearing and in no distress, appropriate for discharge.  Discussed results, findings, treatment and follow up. Patient advised of return precautions. Patient verbalized understanding and agreed with plan.  Final Clinical Impression(s) / ED Diagnoses Final diagnoses:  Bilateral lower extremity edema    Rx / DC Orders ED Discharge Orders    None       Rai Severns, [place] N, PA-C 08/09/20 1333    Arby Barrette, MD 08/17/20 1541

## 2020-08-09 NOTE — Discharge Instructions (Addendum)
Your laboratory work-up, chest x-ray and EKG are reassuring today. Please follow closely with your primary care provider regarding your visit today. It is recommended you wear compression stockings daily to help with swelling.  Elevate your legs as much as possible.  Decrease your salt intake, as this can also be contributing to swelling. Return for any shortness of breath, chest pain, redness in your legs.

## 2020-08-09 NOTE — ED Notes (Signed)
Patient transported to X-ray 

## 2020-08-09 NOTE — ED Triage Notes (Signed)
Pt reports bilateral leg swelling since 4 days ago. 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

## 2020-08-09 NOTE — ED Notes (Signed)
Returned from xray

## 2022-08-01 IMAGING — DX DG CHEST 2V
2 series · 2 of 2 positions shown · non-contrast
Comparison: 06/04/2009

CLINICAL DATA: Shortness of breath and leg swelling.

EXAM:
CHEST - 2 VIEW

[chest pa]
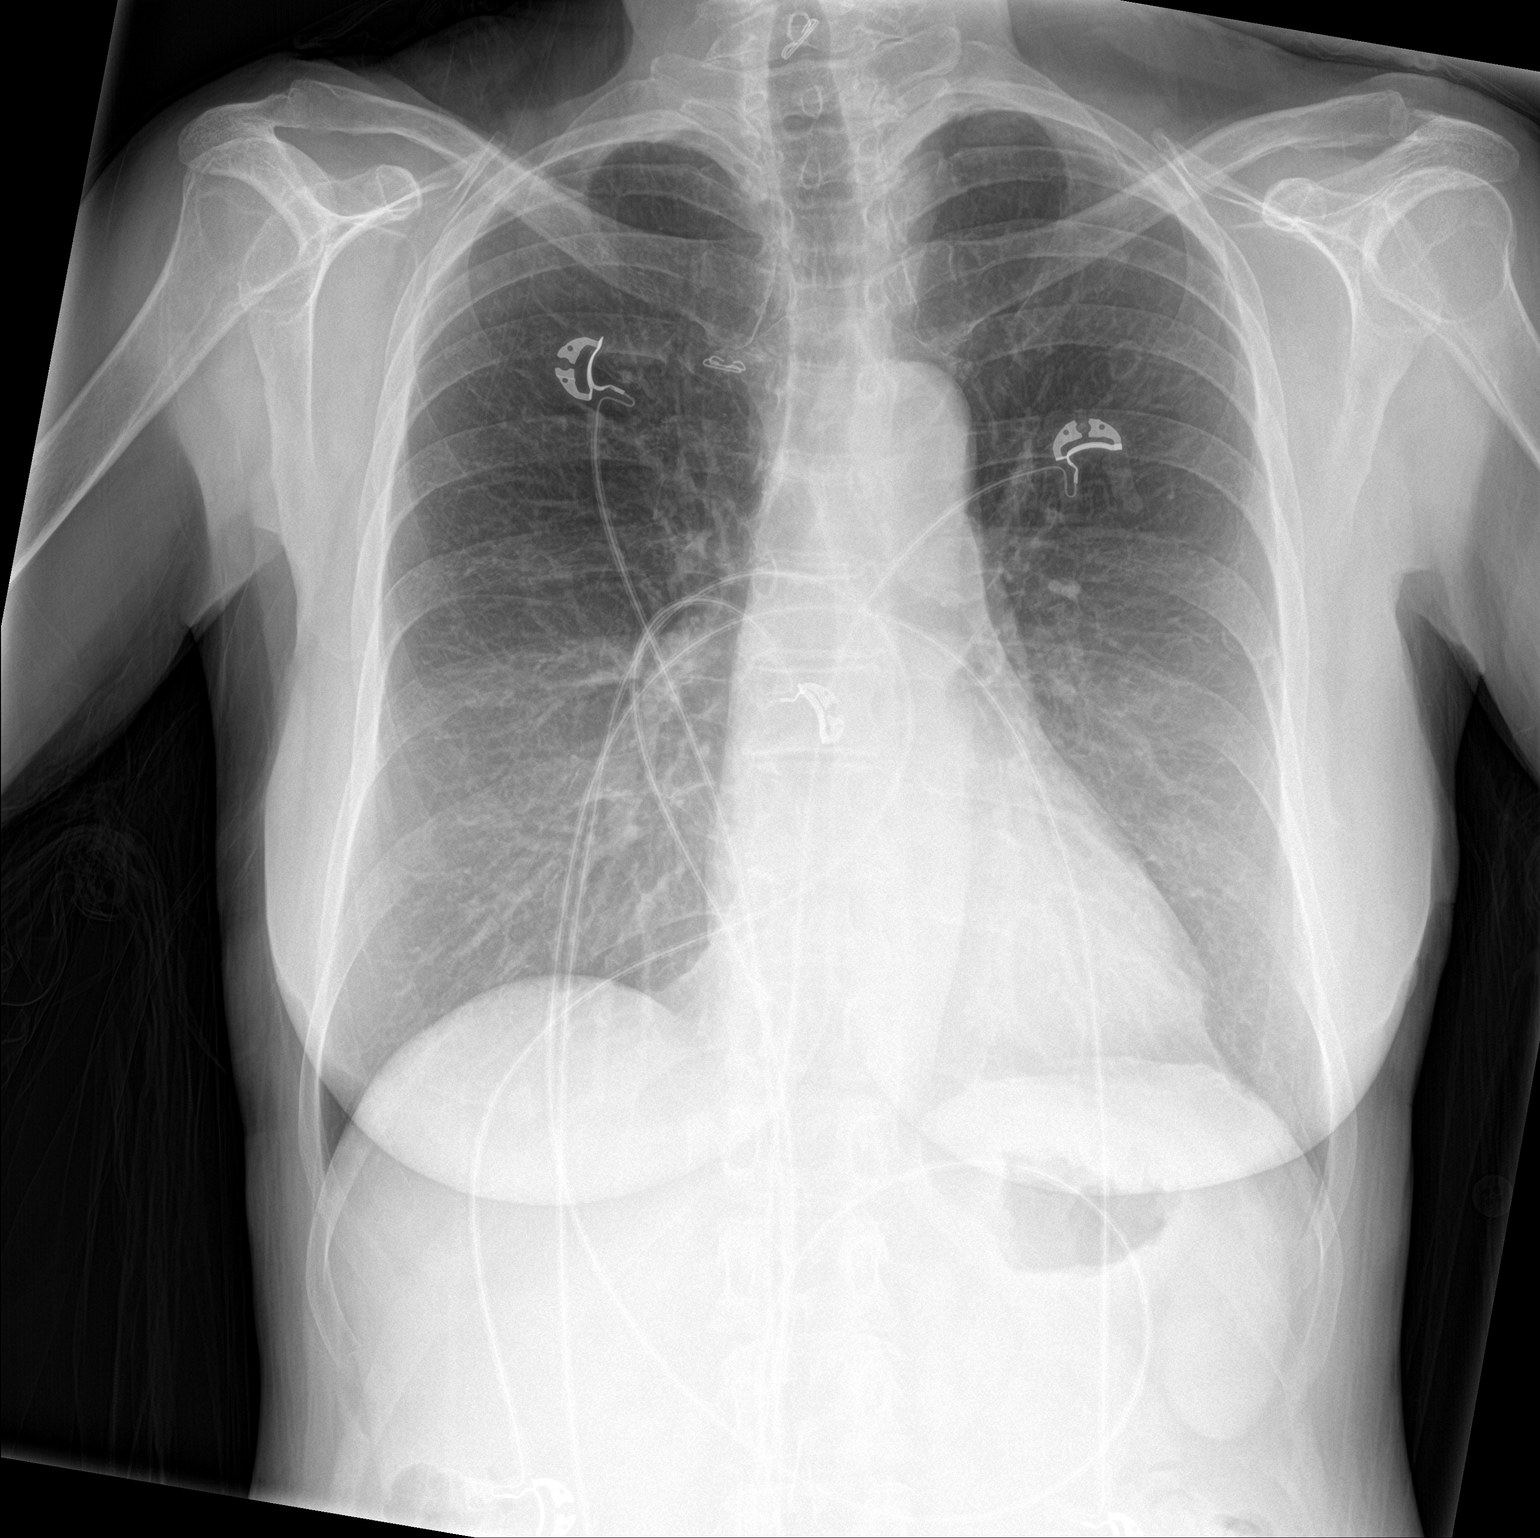

[chest lat]
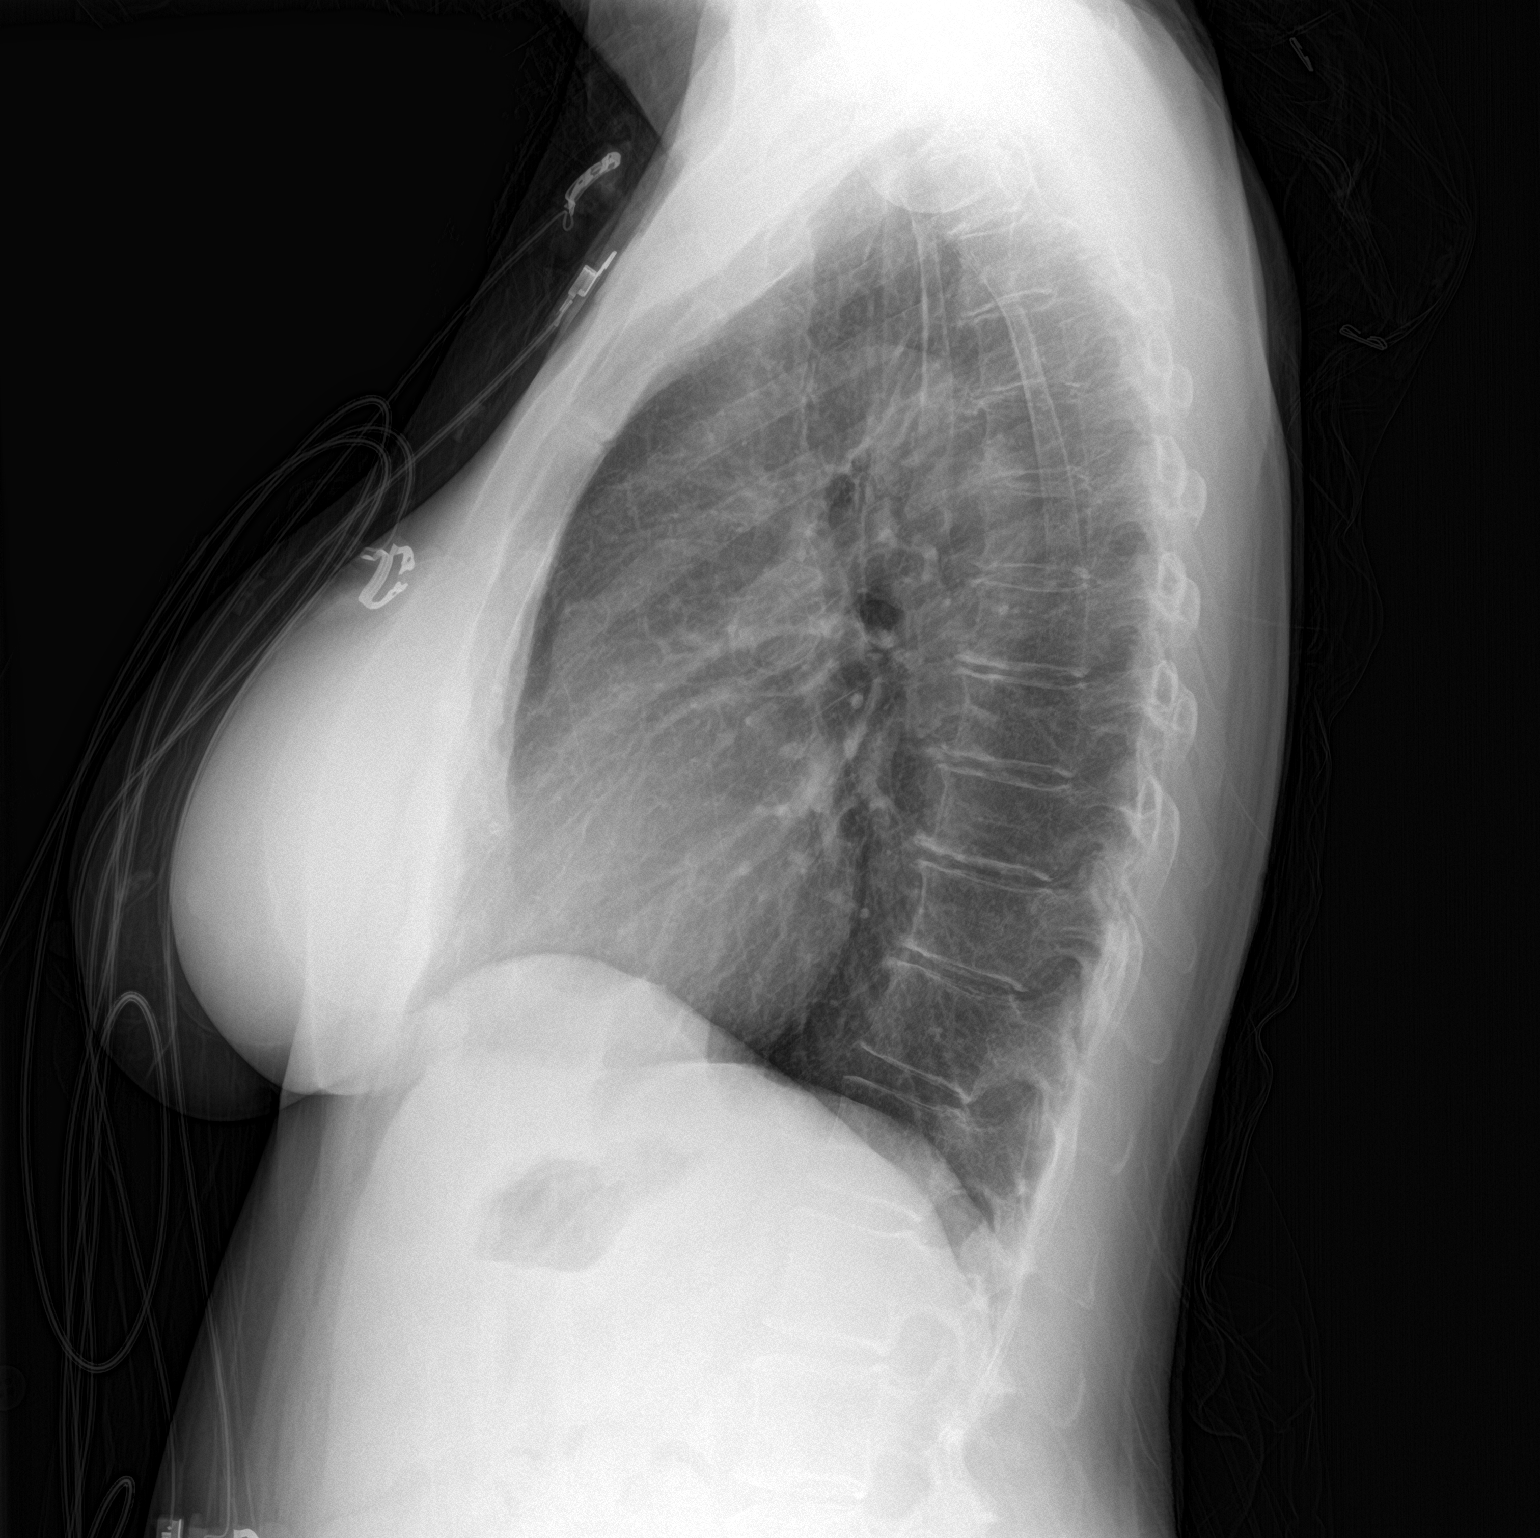

[2 of 2 positions shown; findings below may reference images not displayed]

FINDINGS: The lungs are clear without focal pneumonia, edema, pneumothorax or
pleural effusion. The cardiopericardial silhouette is within normal
limits for size. The visualized bony structures of the thorax show
no acute abnormality. Telemetry leads overlie the chest.
IMPRESSION: No active cardiopulmonary disease.
